# Patient Record
Sex: Male | Born: 1950 | Hispanic: No | Marital: Single | Smoking: Never smoker
Health system: Southern US, Community
[De-identification: ages and names within clinical notes are randomized; demographics above are authoritative.]

---

## 2014-02-22 ENCOUNTER — Encounter (HOSPITAL_COMMUNITY): Payer: Self-pay | Admitting: Emergency Medicine

## 2014-02-22 ENCOUNTER — Emergency Department (HOSPITAL_COMMUNITY): Payer: Self-pay

## 2014-02-22 ENCOUNTER — Emergency Department (HOSPITAL_COMMUNITY)
Admission: EM | Admit: 2014-02-22 | Discharge: 2014-02-22 | Disposition: A | Discharge status: Home / Self Care | Payer: Self-pay | Attending: Emergency Medicine | Admitting: Emergency Medicine

## 2014-02-22 DIAGNOSIS — S01501A Unspecified open wound of lip, initial encounter: Secondary | ICD-10-CM | POA: Insufficient documentation

## 2014-02-22 DIAGNOSIS — K029 Dental caries, unspecified: Secondary | ICD-10-CM | POA: Insufficient documentation

## 2014-02-22 DIAGNOSIS — S022XXA Fracture of nasal bones, initial encounter for closed fracture: Secondary | ICD-10-CM | POA: Insufficient documentation

## 2014-02-22 DIAGNOSIS — IMO0002 Reserved for concepts with insufficient information to code with codable children: Secondary | ICD-10-CM | POA: Insufficient documentation

## 2014-02-22 DIAGNOSIS — S01511A Laceration without foreign body of lip, initial encounter: Secondary | ICD-10-CM

## 2014-02-22 DIAGNOSIS — Z23 Encounter for immunization: Secondary | ICD-10-CM | POA: Insufficient documentation

## 2014-02-22 DIAGNOSIS — S0993XA Unspecified injury of face, initial encounter: Secondary | ICD-10-CM | POA: Insufficient documentation

## 2014-02-22 DIAGNOSIS — S199XXA Unspecified injury of neck, initial encounter: Secondary | ICD-10-CM

## 2014-02-22 MED ORDER — HYDROCODONE-ACETAMINOPHEN 5-325 MG PO TABS: 1.0000 | ORAL_TABLET | ORAL | Status: AC | PRN

## 2014-02-22 MED ORDER — TETANUS-DIPHTH-ACELL PERTUSSIS 5-2.5-18.5 LF-MCG/0.5 IM SUSP
0.5000 mL | Freq: Once | INTRAMUSCULAR | Status: AC
  Administered 2014-02-22: 0.5 mL via INTRAMUSCULAR
  Filled 2014-02-22: qty 0.5

## 2014-02-22 MED ORDER — HYDROCODONE-ACETAMINOPHEN 5-325 MG PO TABS
2.0000 | ORAL_TABLET | Freq: Once | ORAL | Status: AC
Start: 2014-02-22 — End: 2014-02-22
  Administered 2014-02-22: 2 via ORAL
  Filled 2014-02-22: qty 2

## 2014-02-22 NOTE — ED Notes (Signed)
Suture cart at bedside 

## 2014-02-22 NOTE — ED Notes (Signed)
Pt returned from CT with no signs of distress.

## 2014-02-22 NOTE — ED Notes (Signed)
PT ambulated with baseline gait; VSS; A&Ox3; no signs of distress; respirations even and unlabored; skin warm and dry; no questions upon discharge.  
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
## 2014-02-22 NOTE — ED Notes (Signed)
[phone] William Montoya
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
## 2014-02-22 NOTE — Discharge Instructions (Signed)
Take the prescribed medication as directed for pain control. Sutures will dissolve over the next 10-14 days.  Monitor wound for signs of infection-- redness, increased swelling, drainage, high fever, chills, etc. Follow-up with Dr. Emeline DarlingGore regarding your broken nose. Return to the ED for new or worsening symptoms.

## 2014-02-22 NOTE — ED Notes (Signed)
PA at bedside to suture.

## 2014-02-22 NOTE — ED Notes (Signed)
PT can open mouth but complaining of jaw pain. Lower lip laceration. NO bleeding noted.
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
## 2014-03-09 NOTE — ED Provider Notes (Signed)
Medical screening examination/treatment/procedure(s) were performed by non-physician practitioner and as supervising physician I was immediately available for consultation/collaboration.   Tirso Laws L Shavon Ashmore, MD 03/09/14 1503 

## 2015-01-22 IMAGING — CT CT HEAD W/O CM
3 of 6 series · 15 of 47 positions shown, 18 images · non-contrast
Comparison: None.

CLINICAL DATA: Pain post trauma

EXAM:
CT HEAD WITHOUT CONTRAST
CT MAXILLOFACIAL WITHOUT CONTRAST
TECHNIQUE: Multidetector CT imaging of the head and maxillofacial structures
were performed using the standard protocol without intravenous
contrast. Multiplanar CT image reconstructions of the maxillofacial
structures were also generated.

[Series 301: facial bones, idose (1) · axial · 0.35mm/px · z∈[+27,+175]mm · 9 of 88 slices shown, 12 images]
[im 7/88  brain]
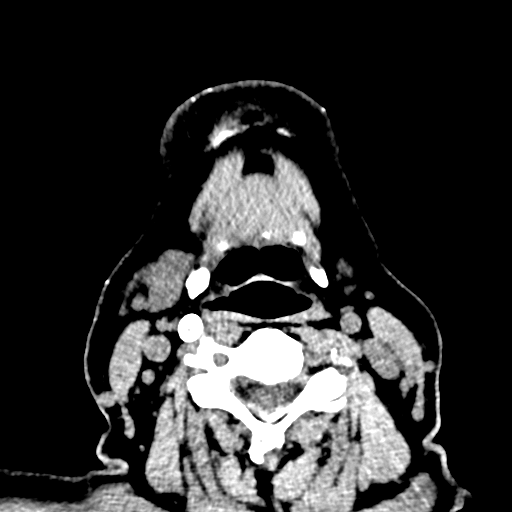
[im 7/88  bone]
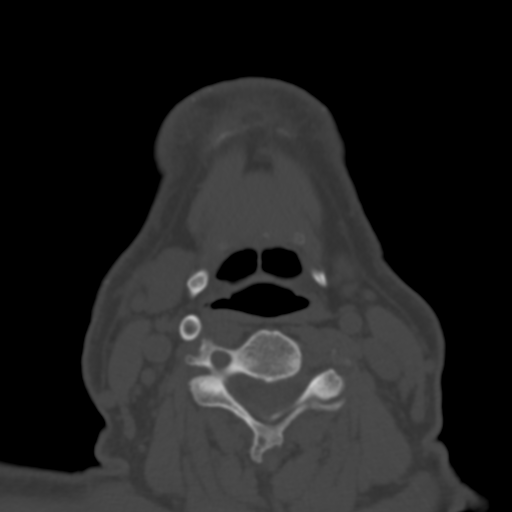
[im 21/88  brain]
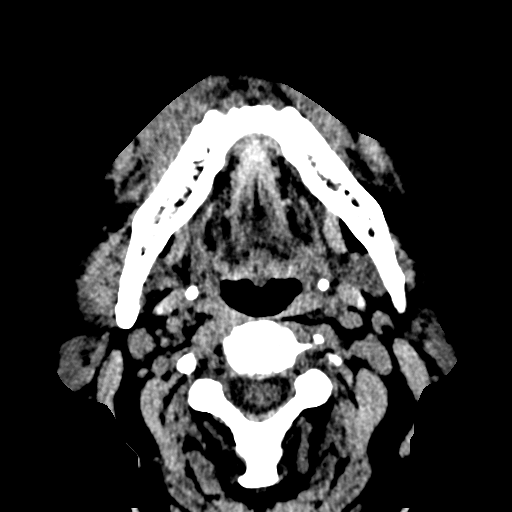
[im 27/88  brain]
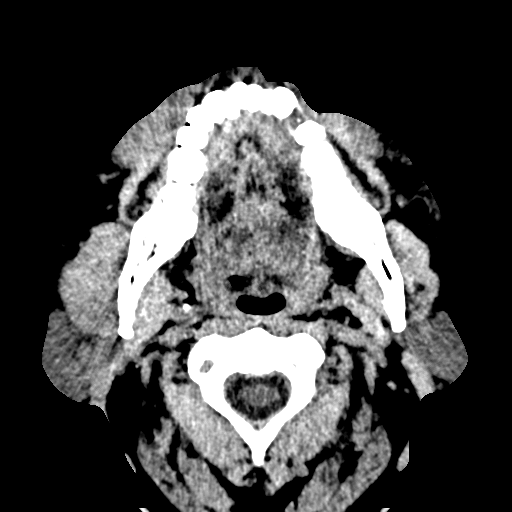
[im 34/88  brain]
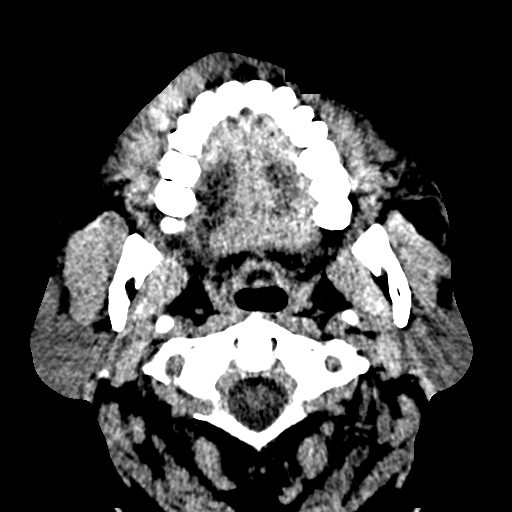
[im 47/88  brain]
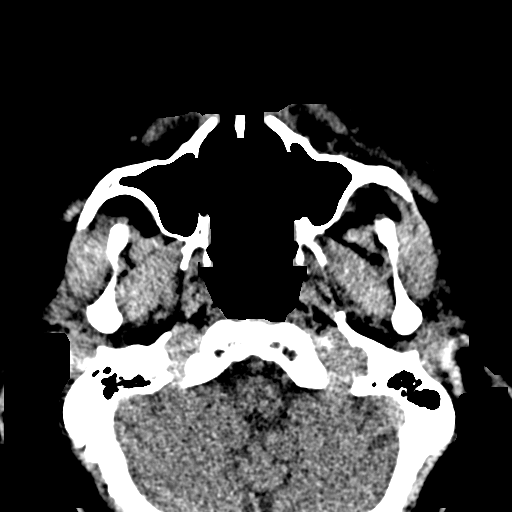
[im 47/88  bone]
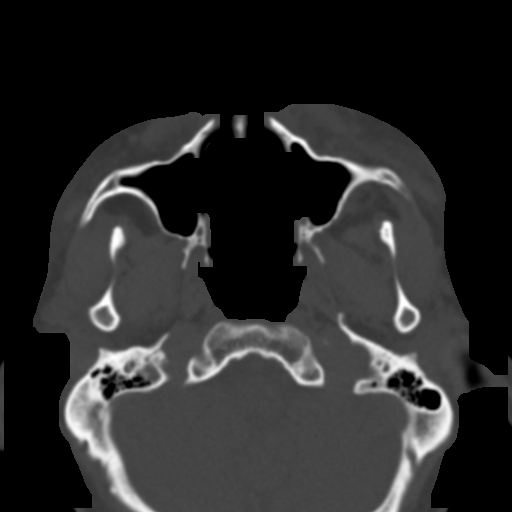
[im 54/88  brain]
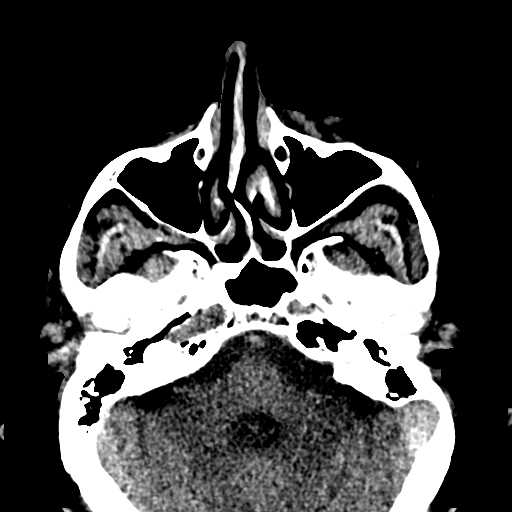
[im 61/88  brain]
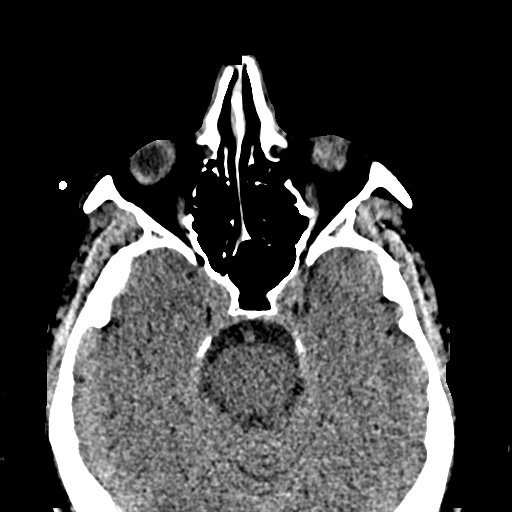
[im 74/88  brain]
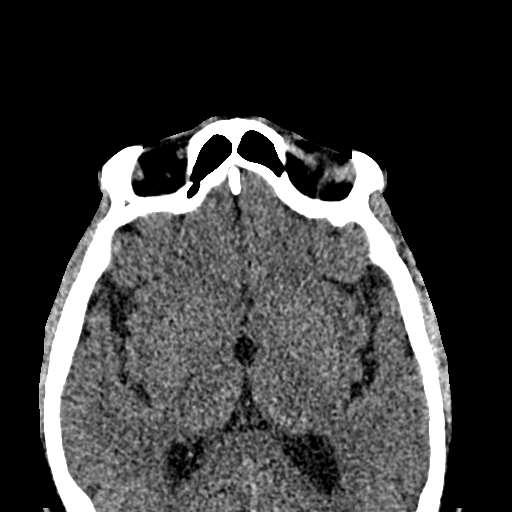
[im 81/88  brain]
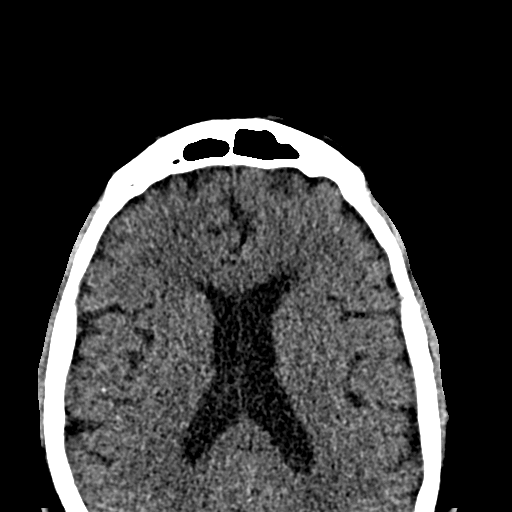
[im 81/88  bone]
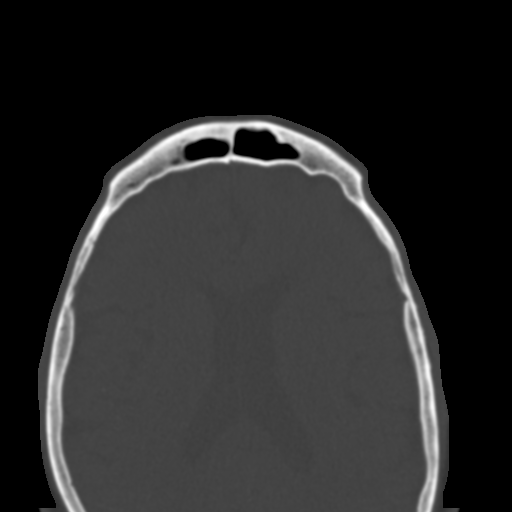

[Series 303: coronal std, idose (1) · coronal · 0.34mm/px · 3 of 87 slices shown]
[im 22/87  brain]
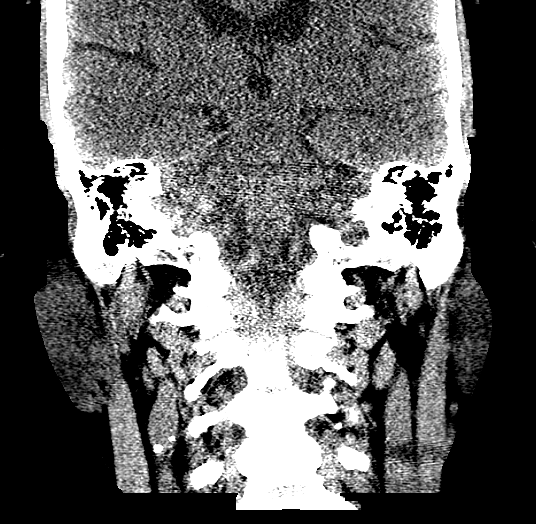
[im 44/87  brain]
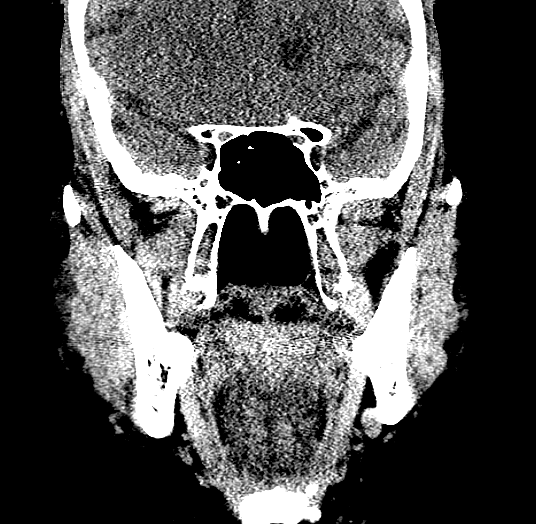
[im 65/87  brain]
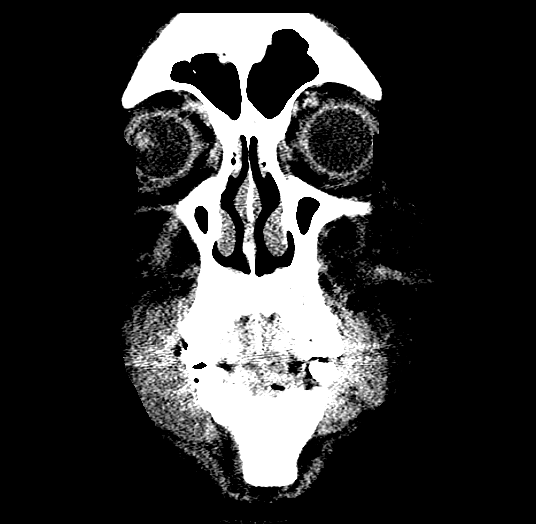

[Series 304: sagittal std, idose (1) · sagittal · 0.34mm/px · 3 of 90 slices shown]
[im 30/90  brain]
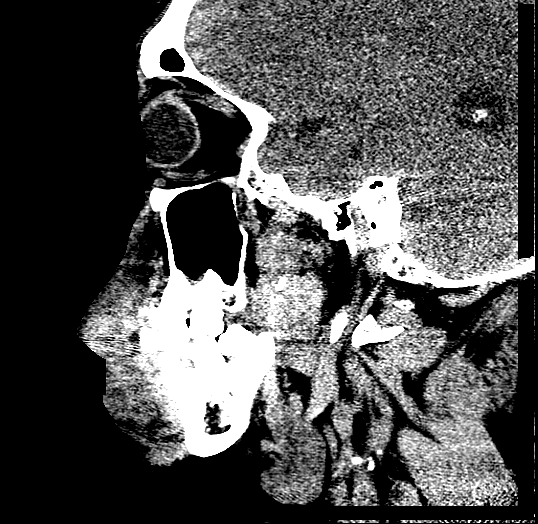
[im 45/90  brain]
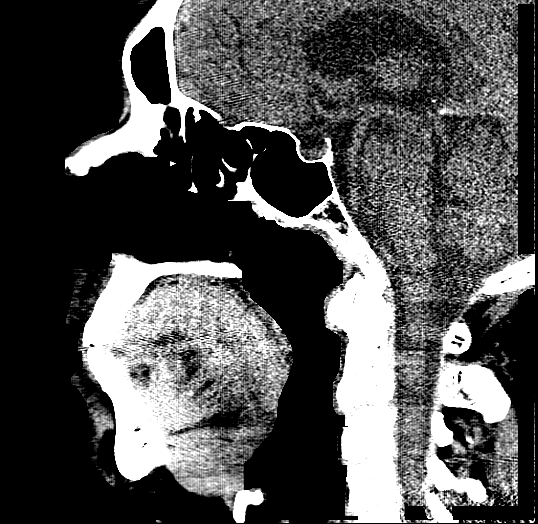
[im 60/90  brain]
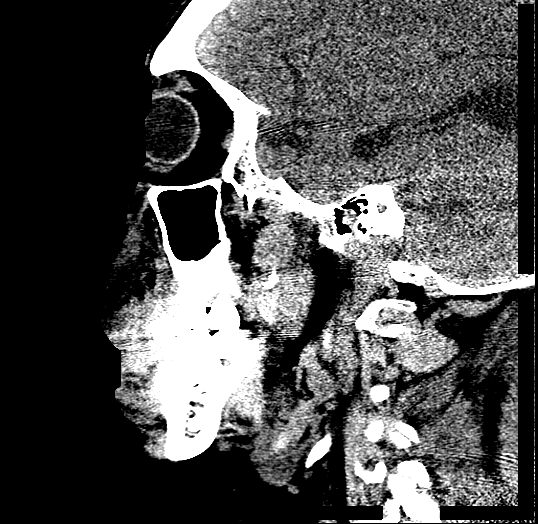

[15 of 47 positions shown; findings below may reference images not displayed]

FINDINGS: CT HEAD FINDINGS

The ventricles are normal in size and configuration. There is no
apparent mass, hemorrhage, extra-axial fluid collection, or midline
shift. Scattered small calcifications are noted in the brain
parenchyma bilaterally, more on the right than on the left, probably
representing small granulomas. There is mild small vessel disease in
the centra semiovale bilaterally. There is no acute appearing
infarct on this study. The bony calvarium appears intact. The
mastoid air cells are clear.

CT MAXILLOFACIAL FINDINGS

There is a slightly displaced fracture of the distal left nasal
bone. No other fracture is appreciated on this study. There is a
defect in the left lamina paprycea, a probable congenital variant.
There is no dislocation.

No intraorbital lesions are identified. There is soft tissue
swelling diffusely over the lower face with hematoma formation, more
severe on the right than on the left but present bilaterally. There
is no mandibular fracture. The left lower bicuspid is missing. There
is soft tissue laceration over the lower left face anteriorly.
Milder soft tissue edema is noted over the mid left face anteriorly
without underlying fracture.

There is mucosal thickening in both maxillary antra inferiorly.
Other paranasal sinuses are clear. Ostiomeatal unit complexes are
patent bilaterally. There is a concha bullosa on the left, an
anatomic variant. Is rightward deviation of the nasal septum.
IMPRESSION: CT head: Scattered small calcifications prior a representing small
granulomas. No edema or mass effect. Mild periventricular small
vessel disease. Study otherwise unremarkable. In particular, there9
is no evidence of acute hemorrhage or extra-axial fluid collection.

CT maxillofacial: Extensive soft tissue edema and hematoma over the
lower face and mandible on both sides with soft tissue injury noted
on the left in the mid face region as well. There are soft tissue
defect over the anterior lower left face consistent with
lacerations. Left lower bicuspid is missing. There is a fracture of
the left nasal bone distally. No other fracture is appreciable. No
dislocation. No intraorbital lesion. There is relatively mild
inferior maxillary sinus disease bilaterally. Other paranasal
sinuses are clear as are the ostiomeatal unit complexes. There is
rightward deviation of the nasal septum.

## 2019-07-08 ENCOUNTER — Other Ambulatory Visit: Payer: Self-pay

## 2019-07-08 DIAGNOSIS — Z20822 Contact with and (suspected) exposure to covid-19: Secondary | ICD-10-CM

## 2019-07-10 LAB — NOVEL CORONAVIRUS, NAA: SARS-CoV-2, NAA: NOT DETECTED

## 2024-08-02 ENCOUNTER — Emergency Department
Admission: EM | Admit: 2024-08-02 | Discharge: 2024-08-02 | Disposition: A | Discharge status: Home / Self Care | Payer: Self-pay | Source: Home / Self Care | Attending: Emergency Medicine | Admitting: Emergency Medicine

## 2024-08-02 ENCOUNTER — Emergency Department: Payer: Self-pay

## 2024-08-02 ENCOUNTER — Other Ambulatory Visit: Payer: Self-pay

## 2024-08-02 DIAGNOSIS — S99911A Unspecified injury of right ankle, initial encounter: Secondary | ICD-10-CM

## 2024-08-02 DIAGNOSIS — X501XXA Overexertion from prolonged static or awkward postures, initial encounter: Secondary | ICD-10-CM | POA: Insufficient documentation

## 2024-08-02 DIAGNOSIS — S82832A Other fracture of upper and lower end of left fibula, initial encounter for closed fracture: Secondary | ICD-10-CM | POA: Insufficient documentation

## 2024-08-02 DIAGNOSIS — Y9301 Activity, walking, marching and hiking: Secondary | ICD-10-CM | POA: Insufficient documentation

## 2024-08-02 MED ORDER — OXYCODONE HCL 5 MG PO TABS: 5.0000 mg | ORAL_TABLET | Freq: Three times a day (TID) | ORAL | 0 refills | Status: AC | PRN

## 2024-08-02 MED ORDER — OXYCODONE HCL 5 MG PO TABS
5.0000 mg | ORAL_TABLET | Freq: Once | ORAL | Status: AC
  Administered 2024-08-02: 5 mg via ORAL
  Filled 2024-08-02: qty 1
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
## 2024-08-02 NOTE — ED Provider Notes (Signed)
 "   [facility] Provider Note     Event Date/Time   First MD Initiated Contact with Patient 08/02/24 2026     (approximate)   History   Fall (L ankle injury)   HPI  William Montoya is a 74 y.o. male with no significant past medical history presents to the ED for evaluation of left ankle injury.  Patient reports he was walking up a hill when his ankle twisted and he fell down.  Localized tenderness to lateral malleolus.  He denies head injury or LOC.  Swelling noted to left ankle.  Patient is unable to bear weight without pain.     Physical Exam   Triage Vital Signs: ED Triage Vitals  Encounter Vitals Group     BP 08/02/24 1926 134/74     Girls Systolic BP Percentile --      Girls Diastolic BP Percentile --      Boys Systolic BP Percentile --      Boys Diastolic BP Percentile --      Pulse Rate 08/02/24 1926 73     Resp 08/02/24 1926 18     Temp 08/02/24 1926 98 F (36.7 C)     Temp Source 08/02/24 1926 Oral     SpO2 08/02/24 1926 96 %     Weight 08/02/24 1931 170 lb (77.1 kg)     Height 08/02/24 1931 5' 10 (1.778 m)     Head Circumference --      Peak Flow --      Pain Score 08/02/24 1930 10     Pain Loc --      Pain Education --      Exclude from Growth Chart --     Most recent vital signs: Vitals:   08/02/24 1926 08/02/24 2159  BP: 134/74 (!) 142/79  Pulse: 73 70  Resp: 18 18  Temp: 98 F (36.7 C)   SpO2: 96% 98%    General Awake, no distress.  HEENT NCAT.  CV:  Good peripheral perfusion.  RESP:  Normal effort.  ABD:  No distention.  Other:  Left lateral malleolus reveals ecchymosis.  There is diffuse swelling.  Tenderness to palpation.  Sensation remains intact.  Motor function limited at ankle joint secondary to pain.  Full range of motion with toes.  Good capillary refill.  Pedal pulses palpated and are 2+.   ED Results / Procedures / Treatments   Labs (all labs ordered are listed, but only  abnormal results are displayed) Labs Reviewed - No data to display  RADIOLOGY  I personally viewed and evaluated these images as part of my medical decision making, as well as reviewing the written report by the radiologist.  ED Provider Interpretation: Acute fracture to the left distal fibula  DG Ankle Complete Left Result Date: 08/02/2024 EXAM: 3 OR MORE VIEW(S) XRAY OF THE LEFT ANKLE 08/02/2024 08:20:17 PM CLINICAL HISTORY: Fall Fall COMPARISON: None available. FINDINGS: BONES AND JOINTS: Mild diffuse bone demineralization. Mostly transverse acute fracture of the distal left fibula with involvement of the tibiofibular joint. No significant displacement. Distal tibia and talus appear intact. The ankle joint space is normal. There are degenerative changes in the intertarsal joints. No malalignment. SOFT TISSUES: Vascular calcifications are present. There is mild lateral soft tissue swelling. IMPRESSION: 1. Mostly transverse acute fracture of the distal left fibula with involvement of the tibiofibular joint, without significant displacement. 2. Mild lateral soft tissue swelling. 3. Mild diffuse osseous demineralization.  Electronically signed by: Elsie Gravely MD 08/02/2024 08:28 PM EST RP Workstation: [id number]    PROCEDURES:  Critical Care performed: No  Procedures   MEDICATIONS ORDERED IN ED: Medications  oxyCODONE  (Oxy IR/ROXICODONE ) immediate release tablet 5 mg (5 mg Oral Given 08/02/24 2100)     IMPRESSION / MDM / ASSESSMENT AND PLAN / ED COURSE  I reviewed the triage vital signs and the nursing notes.                              Clinical Course as of 08/02/24 2208  Sat Aug 02, 2024  2152 DG Ankle Complete Left IMPRESSION: 1. Mostly transverse acute fracture of the distal left fibula with involvement of the tibiofibular joint, without significant displacement. 2. Mild lateral soft tissue swelling. 3. Mild diffuse osseous demineralization.   [MH]    Clinical Course  User Index [MH] [name], PA-C    74 y.o. male presents to the emergency department for evaluation and treatment of fall. See HPI for further details.   Differential diagnosis includes, but is not limited to fracture, sprain, dislocation, contusion  Patient's presentation is most consistent with acute complicated illness / injury requiring diagnostic workup.  Physical exam findings are stated above.  X-ray confirms acute transverse fracture of the left distal fibula.  Given patient's age, consideration on splint application vs cam boot was discussed.  I believe he will benefit more with a cam walking boot and a walker for assistance. He does not meet admission criteria. I have advised the patient nonweightbearing status is much as possible.  We will refer him to podiatry for further evaluation.  Oxycodone  sent to pharmacy.  Patient is in stable condition for discharge home.  ED return precaution discussed.  FINAL CLINICAL IMPRESSION(S) / ED DIAGNOSES   Final diagnoses:  Injury of right ankle, initial encounter  Closed fracture of distal end of left fibula, unspecified fracture morphology, initial encounter   Rx / DC Orders   ED Discharge Orders          Ordered    oxyCODONE  (ROXICODONE ) 5 MG immediate release tablet  Every 8 hours PRN        08/02/24 2152           Note:  This document was prepared using Dragon voice recognition software and may include unintentional dictation errors.    Margrette, Thamar Holik A, PA-C 08/02/24 2209    Dorothyann Drivers, MD 08/02/24 2248  "

## 2024-08-02 NOTE — ED Notes (Signed)
 Left ankle noted to be swollen and tender to touch and movement.
